# Patient Record
Sex: Female | Born: 1945 | Race: White | Hispanic: No | Marital: Married | Smoking: Former smoker
Health system: Southern US, Community
[De-identification: ages and names within clinical notes are randomized; demographics above are authoritative.]

---

## 1998-03-07 ENCOUNTER — Ambulatory Visit (HOSPITAL_COMMUNITY): Admission: RE | Admit: 1998-03-07 | Discharge: 1998-03-07 | Payer: Self-pay | Admitting: Obstetrics & Gynecology

## 1998-05-09 ENCOUNTER — Other Ambulatory Visit: Admission: RE | Admit: 1998-05-09 | Discharge: 1998-05-09 | Payer: Self-pay | Admitting: Obstetrics & Gynecology

## 1999-03-13 ENCOUNTER — Encounter: Payer: Self-pay | Admitting: Obstetrics & Gynecology

## 1999-03-13 ENCOUNTER — Ambulatory Visit (HOSPITAL_COMMUNITY): Admission: RE | Admit: 1999-03-13 | Discharge: 1999-03-13 | Payer: Self-pay | Admitting: Obstetrics & Gynecology

## 1999-06-12 ENCOUNTER — Other Ambulatory Visit: Admission: RE | Admit: 1999-06-12 | Discharge: 1999-06-12 | Payer: Self-pay | Admitting: Obstetrics and Gynecology

## 2000-03-18 ENCOUNTER — Encounter: Payer: Self-pay | Admitting: Obstetrics & Gynecology

## 2000-03-18 ENCOUNTER — Ambulatory Visit (HOSPITAL_COMMUNITY): Admission: RE | Admit: 2000-03-18 | Discharge: 2000-03-18 | Payer: Self-pay | Admitting: Obstetrics & Gynecology

## 2000-07-29 ENCOUNTER — Other Ambulatory Visit: Admission: RE | Admit: 2000-07-29 | Discharge: 2000-07-29 | Payer: Self-pay | Admitting: Obstetrics & Gynecology

## 2000-09-16 ENCOUNTER — Ambulatory Visit (HOSPITAL_COMMUNITY): Admission: RE | Admit: 2000-09-16 | Discharge: 2000-09-16 | Payer: Self-pay | Admitting: Gastroenterology

## 2000-11-09 ENCOUNTER — Encounter: Payer: Self-pay | Admitting: Surgery

## 2000-11-09 ENCOUNTER — Inpatient Hospital Stay (HOSPITAL_COMMUNITY): Admission: EM | Admit: 2000-11-09 | Discharge: 2000-11-11 | Payer: Self-pay | Admitting: *Deleted

## 2000-11-09 ENCOUNTER — Encounter (INDEPENDENT_AMBULATORY_CARE_PROVIDER_SITE_OTHER): Payer: Self-pay | Admitting: Specialist

## 2001-03-24 ENCOUNTER — Ambulatory Visit (HOSPITAL_COMMUNITY): Admission: RE | Admit: 2001-03-24 | Discharge: 2001-03-24 | Payer: Self-pay | Admitting: Obstetrics and Gynecology

## 2001-03-24 ENCOUNTER — Encounter: Payer: Self-pay | Admitting: Obstetrics and Gynecology

## 2001-08-04 ENCOUNTER — Other Ambulatory Visit: Admission: RE | Admit: 2001-08-04 | Discharge: 2001-08-04 | Payer: Self-pay | Admitting: Obstetrics and Gynecology

## 2001-09-08 ENCOUNTER — Other Ambulatory Visit: Admission: RE | Admit: 2001-09-08 | Discharge: 2001-09-08 | Payer: Self-pay | Admitting: Obstetrics and Gynecology

## 2002-03-23 ENCOUNTER — Encounter: Payer: Self-pay | Admitting: Obstetrics and Gynecology

## 2002-03-23 ENCOUNTER — Ambulatory Visit (HOSPITAL_COMMUNITY): Admission: RE | Admit: 2002-03-23 | Discharge: 2002-03-23 | Payer: Self-pay | Admitting: Obstetrics and Gynecology

## 2002-08-10 ENCOUNTER — Other Ambulatory Visit: Admission: RE | Admit: 2002-08-10 | Discharge: 2002-08-10 | Payer: Self-pay | Admitting: Obstetrics and Gynecology

## 2003-10-25 ENCOUNTER — Ambulatory Visit (HOSPITAL_COMMUNITY): Admission: RE | Admit: 2003-10-25 | Discharge: 2003-10-25 | Payer: Self-pay | Admitting: Internal Medicine

## 2004-12-18 ENCOUNTER — Ambulatory Visit (HOSPITAL_COMMUNITY): Admission: RE | Admit: 2004-12-18 | Discharge: 2004-12-18 | Payer: Self-pay | Admitting: Internal Medicine

## 2005-11-05 ENCOUNTER — Other Ambulatory Visit: Admission: RE | Admit: 2005-11-05 | Discharge: 2005-11-05 | Payer: Self-pay | Admitting: Internal Medicine

## 2005-12-24 ENCOUNTER — Ambulatory Visit (HOSPITAL_COMMUNITY): Admission: RE | Admit: 2005-12-24 | Discharge: 2005-12-24 | Payer: Self-pay | Admitting: Internal Medicine

## 2006-12-30 ENCOUNTER — Ambulatory Visit (HOSPITAL_COMMUNITY): Admission: RE | Admit: 2006-12-30 | Discharge: 2006-12-30 | Payer: Self-pay | Admitting: Internal Medicine

## 2007-12-08 ENCOUNTER — Other Ambulatory Visit: Admission: RE | Admit: 2007-12-08 | Discharge: 2007-12-08 | Payer: Self-pay | Admitting: Internal Medicine

## 2008-01-05 ENCOUNTER — Ambulatory Visit (HOSPITAL_COMMUNITY): Admission: RE | Admit: 2008-01-05 | Discharge: 2008-01-05 | Payer: Self-pay | Admitting: Internal Medicine

## 2009-01-31 ENCOUNTER — Ambulatory Visit (HOSPITAL_COMMUNITY): Admission: RE | Admit: 2009-01-31 | Discharge: 2009-01-31 | Payer: Self-pay | Admitting: Family Medicine

## 2010-02-06 ENCOUNTER — Ambulatory Visit (HOSPITAL_COMMUNITY): Admission: RE | Admit: 2010-02-06 | Discharge: 2010-02-06 | Payer: Self-pay | Admitting: Internal Medicine

## 2010-02-20 ENCOUNTER — Other Ambulatory Visit: Admission: RE | Admit: 2010-02-20 | Discharge: 2010-02-20 | Payer: Self-pay | Admitting: Internal Medicine

## 2011-01-08 ENCOUNTER — Other Ambulatory Visit (HOSPITAL_COMMUNITY): Payer: Self-pay | Admitting: Internal Medicine

## 2011-01-08 DIAGNOSIS — Z1231 Encounter for screening mammogram for malignant neoplasm of breast: Secondary | ICD-10-CM

## 2011-01-08 DIAGNOSIS — Z1239 Encounter for other screening for malignant neoplasm of breast: Secondary | ICD-10-CM

## 2011-02-12 ENCOUNTER — Ambulatory Visit (HOSPITAL_COMMUNITY)
Admission: RE | Admit: 2011-02-12 | Discharge: 2011-02-12 | Disposition: A | Discharge status: Home / Self Care | Payer: BC Managed Care – PPO | Source: Ambulatory Visit | Attending: Internal Medicine | Admitting: Internal Medicine

## 2011-02-12 DIAGNOSIS — Z1231 Encounter for screening mammogram for malignant neoplasm of breast: Secondary | ICD-10-CM

## 2011-04-30 NOTE — H&P (Signed)
[place]. [facility]  Patient:    Veronica Barker, Veronica Barker                      MRN: [id number] Adm. Date:  [id number] Attending:  Andre Lefort CC:         Dr. Louanna Raw, [facility]  Edward Jolly, M.D.  Dr. Charna Elizabeth   History and Physical  DATE OF BIRTH:  May 12, 1946  HISTORY OF ILLNESS:  Veronica Barker is a 65 year old white female whose primary medical doctor is Dr. Wilson Singer, a gynecologist, and she has recently seen Dr. Loreta Ave for a GI evaluation.  She has had a head cold for the last few days and awoke this morning with abdominal pain which led to nausea and vomiting.  At first, she thought she had a GI bug but as the pain became worse, she went to the [facility] where she saw Dr. Louanna Raw.  His clinical impression was that she probably had appendicitis and asked me to see her.  Veronica Barker has had no prior history of peptic ulcer disease, liver disease, gallbladder disease, or pancreatic disease. Apparently she had a guaiac positive stool about three to four months ago and had a colonoscopy by Dr. Charna Elizabeth about two months ago which by the patients report, was negative.  She has had no prior abdominal surgery.  ALLERGIES:  She has no allergies.  MEDICATIONS:  Premphase and Caltrate.  REVIEW OF SYSTEMS:  PULMONARY:  Smokes cigarettes and knows this is bad for her health.  She also has an upper respiratory infection right now with a head cold and a kind of bronchitic cough.  CARDIAC:  She has no history of heart disease, chest pain, or hypertension.  GASTROINTESTINAL: See History of Present Illness.  NEUROLOGIC:  No history of kidney stones or kidney infection.  GYN:  Her last period was three to four months ago.  She says she has been going through menopause over the last years time.  She has had no prior gynecologic problems.  SOCIAL HISTORY:  She works at [employer] and is accompanied  by her husband in the Emergency Room.  PHYSICAL EXAMINATION:  VITAL SIGNS:  Her blood pressure is 137/74.  Pulse 59.  Respirations 16.  Temperature 96.8.  HEENT:  She had a little bit of a head cold.  I examined both of her ears and her right ear is obscured with ear wax.  Her left TM is mildly inflamed.  She may have some very mild otitis media on the left side.  Her throat is mildly red but otherwise, unremarkable.  NECK:  Her neck is supple without mass or thyromegaly.  LUNGS:  Her lungs are clear to osculation with a little bit of congestion in the upper airway.  CARDIAC:  Her heart has a regular rate and rhythm without murmur or rub.  ABDOMEN:  Her abdomen shows decrease in present bowel sounds with localized tenderness, guarding, and rebound in the right lower quadrant.  RECTAL:  I did not do a rectum because of recent colonoscopy.  EXTREMITIES:  She has good strength of all four extremities.  NEUROLOGIC:  Grossly intact.  LABORATORY AND ACCESSORY DATA:  Sodium 139.  Potassium 4.0.  Chloride 106. CO2 26.  Glucose 113. Liver functions are all within normal limits.  Amylase 42.  White blood count 14,400.  Hemoglobin 12.6.  Hematocrit 37.7.  Urinalysis was negative.  ASSESSMENT:  My clinical impression is that she probably has appendicitis and may have gastroenteritis.  I discussed with the patient and her husband about proceeding with surgery which I think she will be best served by undergoing. I outlined the indications, surgery potentials, risk of surgery including bleeding, injury to bowel and possibly open surgery.  PLAN: 1. We will pursue a laparoscopic appendix at this time. 2. Smokes cigarettes which she knows is bad for her health. 3. Upper inspiratory infection. DD:  11/09/00 TD:  11/09/00 Job: [id number]

## 2011-04-30 NOTE — Procedures (Signed)
[place]. [facility]  Patient:    Veronica Barker, Veronica Barker                      MRN: [id number] Proc. Date: 09/16/00 Adm. Date:  [id number] Attending:  Charna Elizabeth CC:         Cecilio Asper, M.D.   Procedure Report  DATE OF BIRTH:  12/13/1946  REFERRING PHYSICIAN:  Cecilio Asper, M.D.  PROCEDURE:  Colonoscopy.  ENDOSCOPIST:  Anselmo Rod, M.D.  INSTRUMENTS USED:  Olympus video colonoscope.  INDICATIONS:  Guaiac positive stools in a 65 year old white female.  Rule out colonic polyps, masses, hemorrhoids, etc.  PREPROCEDURE PREPARATION:  Informed consent was procured from the patient. The patient was fasted for eight hours prior to the procedure and prepped with a bottle of magnesium citrate and a gallon of NuLytely the night prior to the procedure.  PREPROCEDURE PHYSICAL EXAMINATION:  VITAL SIGNS:  The patient had stable vital signs.  NECK:  Supple.  CHEST:  Clear to auscultation, S1, S2 regular.  ABDOMEN:  Soft with normal abdominal bowel sounds.  DESCRIPTION OF PROCEDURE:  The patient was placed in the left lateral decubitus position and sedated with 50 mg of Demerol and 5 mg of Versed intravenously.  Once the patient was adequately sedated and maintained on low-flow oxygen and continuous cardiac monitoring, the Olympus video colonoscope was advanced from the rectum to the cecum without difficulty.  The patient had significant left-sided diverticulosis.  No masses, polyps, erosions, ulcerations, or hemorrhoids were seen.  The procedure was completed up to the cecum.  The entire colonic mucosa, cecum, right colon, and transverse colon appeared healthy.  IMPRESSION:  Left-sided diverticulosis, otherwise normal appearing colon.  RECOMMENDATIONS: 1. The patient has been advised to increase fluids and fiber in her diet. 2. Repeat guaiac testing has been recommended on an outpatient basis.  Further    recommendations will be  made at that time. DD:  09/16/00 TD:  09/16/00 Job: [id number]

## 2011-04-30 NOTE — Op Note (Signed)
[place]. [facility]  Patient:    Veronica Barker, Veronica Barker                      MRN: [id number] Proc. Date: 11/09/00 Adm. Date:  [id number] Disc. Date: [id number] Attending:  Charna Elizabeth CC:         Dr. Louanna Raw, [facility]  Dr. Wilson Singer  Dr. Anselmo Rod   Operative Report  DATE OF BIRTH:  [birth year]  PREOPERATIVE DIAGNOSIS:  Appendicitis.  POSTOPERATIVE DIAGNOSIS:  Acute appendicitis.  PROCEDURE:  Laparoscopic appendectomy.  SURGEON:  Sandria Bales. Ezzard Standing, M.D.  ASSISTANT:  None.  ANESTHESIA:  General endotracheal.  ESTIMATED BLOOD LOSS:  Minimal.  INDICATION FOR PROCEDURE: Veronica Barker she is a 65 year old white female, who awoke this morning with diffuse abdominal pain, which is now localized to the right lower quadrant.  She has a [place] blood count of [address] and a physical examination consistent with acute appendicitis.  She now comes for attempted appendectomy.  OPERATIVE NOTE:  Patient placed in a supine position with the left arm tucked, right arm to her side.  She was already given 2 g of cefotetan at the initiation of the procedure.  She had a Foley catheter in place.  Her abdomen was prepped with Betadine solution and sterilely draped.  An infraumbilical incision was made with sharp dissection and carried down to the abdominal cavity.  A 0 degree laparoscope was inserted through a 12 mm Hasson trocar and this was secured with a 0 Vicryl suture.  Right and left lobes of the liver were unremarkable, the stomach was unremarkable, the gallbladder was unremarkable.  Her pelvic organs, which could be seen well, including her uterus and both tubes were unremarkable.  In the right lower quadrant, she had an inflammatory area consistent with acute appendicitis.  I then placed two additional trocars, a 5 mm Ethicon trocar in the right subcostal location and a 10 mm Ethicon trocar in the left lower quadrant location.  The  mesentery of the appendix was identified, divided with the Harmonic scalpel down to the base of the appendix.  I then used a vascular EndoGIA stapler to staple across the base of the appendix.  I then placed the appendix in the EndoCatch bag and delivered it through the umbilicus.  I then irrigated out the abdominal cavity.  There was no bleeding.  The bowel staple line looked good.  I then removed each trocar under direct visualization.  The umbilical trocar was closed with a 0 Vicryl suture.  The skin at each site was closed with a 4-0 Monocryl suture.  Each site was painted with tincture of benzoin, Steri-Strips and sterilely dressed.  The patient tolerated the procedure well, was transported to the recovery room in good condition.  Needle and sponge count were correct at the end of the case. DD:  11/09/00 TD:  11/09/00 Job: [id number]

## 2012-03-24 ENCOUNTER — Other Ambulatory Visit (HOSPITAL_COMMUNITY): Payer: Self-pay | Admitting: Internal Medicine

## 2012-03-24 DIAGNOSIS — Z1231 Encounter for screening mammogram for malignant neoplasm of breast: Secondary | ICD-10-CM

## 2012-04-21 ENCOUNTER — Ambulatory Visit (HOSPITAL_COMMUNITY)
Admission: RE | Admit: 2012-04-21 | Discharge: 2012-04-21 | Disposition: A | Discharge status: Home / Self Care | Payer: BC Managed Care – PPO | Source: Ambulatory Visit | Attending: Internal Medicine | Admitting: Internal Medicine

## 2012-04-21 DIAGNOSIS — Z1231 Encounter for screening mammogram for malignant neoplasm of breast: Secondary | ICD-10-CM | POA: Insufficient documentation

## 2012-05-26 ENCOUNTER — Other Ambulatory Visit (HOSPITAL_COMMUNITY)
Admission: RE | Admit: 2012-05-26 | Discharge: 2012-05-26 | Disposition: A | Discharge status: Home / Self Care | Payer: BC Managed Care – PPO | Source: Ambulatory Visit | Attending: Internal Medicine | Admitting: Internal Medicine

## 2012-05-26 ENCOUNTER — Other Ambulatory Visit: Payer: Self-pay | Admitting: Internal Medicine

## 2012-05-26 DIAGNOSIS — Z124 Encounter for screening for malignant neoplasm of cervix: Secondary | ICD-10-CM | POA: Insufficient documentation

## 2012-07-07 ENCOUNTER — Other Ambulatory Visit: Payer: Self-pay | Admitting: Internal Medicine

## 2012-07-07 DIAGNOSIS — Z0000 Encounter for general adult medical examination without abnormal findings: Secondary | ICD-10-CM

## 2012-07-14 ENCOUNTER — Ambulatory Visit
Admission: RE | Admit: 2012-07-14 | Discharge: 2012-07-14 | Disposition: A | Discharge status: Home / Self Care | Payer: No Typology Code available for payment source | Source: Ambulatory Visit | Attending: Internal Medicine | Admitting: Internal Medicine

## 2012-07-14 DIAGNOSIS — Z0000 Encounter for general adult medical examination without abnormal findings: Secondary | ICD-10-CM

## 2013-04-02 ENCOUNTER — Other Ambulatory Visit (HOSPITAL_COMMUNITY): Payer: Self-pay | Admitting: Internal Medicine

## 2013-04-02 DIAGNOSIS — Z1231 Encounter for screening mammogram for malignant neoplasm of breast: Secondary | ICD-10-CM

## 2013-04-23 ENCOUNTER — Ambulatory Visit (HOSPITAL_COMMUNITY)
Admission: RE | Admit: 2013-04-23 | Discharge: 2013-04-23 | Disposition: A | Discharge status: Home / Self Care | Payer: Medicare Other | Source: Ambulatory Visit | Attending: Internal Medicine | Admitting: Internal Medicine

## 2013-04-23 DIAGNOSIS — Z1231 Encounter for screening mammogram for malignant neoplasm of breast: Secondary | ICD-10-CM | POA: Insufficient documentation

## 2013-06-08 ENCOUNTER — Other Ambulatory Visit: Payer: Self-pay | Admitting: Internal Medicine

## 2013-06-08 DIAGNOSIS — R911 Solitary pulmonary nodule: Secondary | ICD-10-CM

## 2013-07-20 ENCOUNTER — Ambulatory Visit
Admission: RE | Admit: 2013-07-20 | Discharge: 2013-07-20 | Disposition: A | Discharge status: Home / Self Care | Payer: Medicare Other | Source: Ambulatory Visit | Attending: Internal Medicine | Admitting: Internal Medicine

## 2013-07-20 DIAGNOSIS — R911 Solitary pulmonary nodule: Secondary | ICD-10-CM

## 2014-05-17 ENCOUNTER — Other Ambulatory Visit (HOSPITAL_COMMUNITY): Payer: Self-pay | Admitting: Internal Medicine

## 2014-05-17 DIAGNOSIS — Z1231 Encounter for screening mammogram for malignant neoplasm of breast: Secondary | ICD-10-CM

## 2014-05-23 ENCOUNTER — Ambulatory Visit (HOSPITAL_COMMUNITY)
Admission: RE | Admit: 2014-05-23 | Discharge: 2014-05-23 | Disposition: A | Discharge status: Home / Self Care | Payer: Medicare Other | Source: Ambulatory Visit | Attending: Internal Medicine | Admitting: Internal Medicine

## 2014-05-23 DIAGNOSIS — Z1231 Encounter for screening mammogram for malignant neoplasm of breast: Secondary | ICD-10-CM | POA: Insufficient documentation

## 2016-01-01 DIAGNOSIS — H524 Presbyopia: Secondary | ICD-10-CM | POA: Diagnosis not present

## 2016-01-01 DIAGNOSIS — H5203 Hypermetropia, bilateral: Secondary | ICD-10-CM | POA: Diagnosis not present

## 2016-01-01 DIAGNOSIS — H52223 Regular astigmatism, bilateral: Secondary | ICD-10-CM | POA: Diagnosis not present

## 2016-06-30 DIAGNOSIS — Z1389 Encounter for screening for other disorder: Secondary | ICD-10-CM | POA: Diagnosis not present

## 2016-06-30 DIAGNOSIS — Z0000 Encounter for general adult medical examination without abnormal findings: Secondary | ICD-10-CM | POA: Diagnosis not present

## 2016-06-30 DIAGNOSIS — E782 Mixed hyperlipidemia: Secondary | ICD-10-CM | POA: Diagnosis not present

## 2016-06-30 DIAGNOSIS — E669 Obesity, unspecified: Secondary | ICD-10-CM | POA: Diagnosis not present

## 2016-06-30 DIAGNOSIS — Z6830 Body mass index (BMI) 30.0-30.9, adult: Secondary | ICD-10-CM | POA: Diagnosis not present

## 2016-08-19 DIAGNOSIS — Z86010 Personal history of colonic polyps: Secondary | ICD-10-CM | POA: Diagnosis not present

## 2016-08-19 DIAGNOSIS — Z1211 Encounter for screening for malignant neoplasm of colon: Secondary | ICD-10-CM | POA: Diagnosis not present

## 2016-08-19 DIAGNOSIS — K5730 Diverticulosis of large intestine without perforation or abscess without bleeding: Secondary | ICD-10-CM | POA: Diagnosis not present

## 2016-09-08 DIAGNOSIS — K5730 Diverticulosis of large intestine without perforation or abscess without bleeding: Secondary | ICD-10-CM | POA: Diagnosis not present

## 2016-09-08 DIAGNOSIS — K635 Polyp of colon: Secondary | ICD-10-CM | POA: Diagnosis not present

## 2016-09-08 DIAGNOSIS — D123 Benign neoplasm of transverse colon: Secondary | ICD-10-CM | POA: Diagnosis not present

## 2016-09-08 DIAGNOSIS — Z86010 Personal history of colonic polyps: Secondary | ICD-10-CM | POA: Diagnosis not present

## 2016-09-08 DIAGNOSIS — Z1211 Encounter for screening for malignant neoplasm of colon: Secondary | ICD-10-CM | POA: Diagnosis not present

## 2016-09-23 ENCOUNTER — Other Ambulatory Visit: Payer: Self-pay | Admitting: Internal Medicine

## 2016-09-23 DIAGNOSIS — Z1231 Encounter for screening mammogram for malignant neoplasm of breast: Secondary | ICD-10-CM

## 2016-10-06 ENCOUNTER — Ambulatory Visit
Admission: RE | Admit: 2016-10-06 | Discharge: 2016-10-06 | Disposition: A | Discharge status: Home / Self Care | Payer: PPO | Source: Ambulatory Visit | Attending: Internal Medicine | Admitting: Internal Medicine

## 2016-10-06 DIAGNOSIS — Z1231 Encounter for screening mammogram for malignant neoplasm of breast: Secondary | ICD-10-CM | POA: Diagnosis not present

## 2016-12-07 DIAGNOSIS — D2271 Melanocytic nevi of right lower limb, including hip: Secondary | ICD-10-CM | POA: Diagnosis not present

## 2016-12-07 DIAGNOSIS — D225 Melanocytic nevi of trunk: Secondary | ICD-10-CM | POA: Diagnosis not present

## 2016-12-07 DIAGNOSIS — D2272 Melanocytic nevi of left lower limb, including hip: Secondary | ICD-10-CM | POA: Diagnosis not present

## 2016-12-07 DIAGNOSIS — L821 Other seborrheic keratosis: Secondary | ICD-10-CM | POA: Diagnosis not present

## 2016-12-07 DIAGNOSIS — L814 Other melanin hyperpigmentation: Secondary | ICD-10-CM | POA: Diagnosis not present

## 2016-12-21 ENCOUNTER — Encounter: Payer: Self-pay | Admitting: Podiatry

## 2016-12-21 ENCOUNTER — Ambulatory Visit (INDEPENDENT_AMBULATORY_CARE_PROVIDER_SITE_OTHER): Payer: PPO

## 2016-12-21 ENCOUNTER — Ambulatory Visit (INDEPENDENT_AMBULATORY_CARE_PROVIDER_SITE_OTHER): Payer: PPO | Admitting: Podiatry

## 2016-12-21 ENCOUNTER — Other Ambulatory Visit: Payer: Self-pay | Admitting: *Deleted

## 2016-12-21 VITALS — BP 152/76 | HR 62 | Resp 16

## 2016-12-21 DIAGNOSIS — M779 Enthesopathy, unspecified: Secondary | ICD-10-CM

## 2016-12-21 DIAGNOSIS — M79671 Pain in right foot: Secondary | ICD-10-CM | POA: Diagnosis not present

## 2016-12-21 DIAGNOSIS — M778 Other enthesopathies, not elsewhere classified: Secondary | ICD-10-CM

## 2016-12-21 DIAGNOSIS — M7751 Other enthesopathy of right foot: Secondary | ICD-10-CM

## 2016-12-21 NOTE — Progress Notes (Signed)
   Subjective:    Patient ID: Veronica Barker, female    DOB: Apr 16, 1946, 71 y.o.   MRN: [id number]  HPI: She presents today with a three-week history of pain to the dorsal aspect of her right foot overlying the third knuckle. She states that she is an avid bowler and likes to use the bicycle and elliptical and treadmill. She denies any trauma.    Review of Systems  HENT: Positive for sinus pressure.   Gastrointestinal: Positive for abdominal distention.  Hematological: Bruises/bleeds easily.  All other systems reviewed and are negative.      Objective:   Physical Exam: Vital signs are stable alert and oriented 3. Pulses are palpable. Neurologic sensorium is intact. Deep tendon reflexes are intact. Muscle strength +5 over 5 dorsiflexion plantar flexors and inverters and everters all into the musculature is intact. She has pain on end range of motion of the third metatarsophalangeal joint. Radiographs do not demonstrate this time a fracture through the third metatarsal. No other osseous abnormalities are noted. Cutaneous evaluation demonstrates supple well-hydrated cutis no erythema edema cellulitis drainage or odor other than the edema overlying the joints.        Assessment & Plan:  Capsulitis third metatarsophalangeal joint right foot.  Plan: Injected Kenalog periarticular around the third metatarsophalangeal joint right foot and placed her in a Cam Walker suggested she stay in his cam walker until follow-up with me in 1 month.

## 2017-01-18 ENCOUNTER — Ambulatory Visit (INDEPENDENT_AMBULATORY_CARE_PROVIDER_SITE_OTHER): Payer: PPO | Admitting: Podiatry

## 2017-01-18 ENCOUNTER — Ambulatory Visit (INDEPENDENT_AMBULATORY_CARE_PROVIDER_SITE_OTHER): Payer: PPO

## 2017-01-18 ENCOUNTER — Encounter: Payer: Self-pay | Admitting: Podiatry

## 2017-01-18 DIAGNOSIS — M778 Other enthesopathies, not elsewhere classified: Secondary | ICD-10-CM

## 2017-01-18 DIAGNOSIS — M7751 Other enthesopathy of right foot: Secondary | ICD-10-CM | POA: Diagnosis not present

## 2017-01-18 DIAGNOSIS — M779 Enthesopathy, unspecified: Principal | ICD-10-CM

## 2017-01-18 NOTE — Progress Notes (Signed)
She presents today for follow-up of capsulitis to the third metatarsal phalangeal joint of the right foot. She states that she is doing much better and the pain is no longer present. She continues to wear her Darco shoe on a regular basis.  Objective: Vital signs are stable she is alert and oriented 3. Pulses are palpable. She has no further pain on palpation or range of motion of the third or second metatarsophalangeal joint on the right foot. Radiographs taken today confirm that there are no fractures and it appears that the inflammation has decreased considerably due to the cortisone injection.  Assessment: Well-healing capsulitis third metatarsophalangeal joint right foot.  Plan: Follow up with me on a regular basis she may get back to her bowling.  Dr. Roselind Messier DPM

## 2017-02-04 DIAGNOSIS — H5203 Hypermetropia, bilateral: Secondary | ICD-10-CM | POA: Diagnosis not present

## 2017-07-05 DIAGNOSIS — Z6831 Body mass index (BMI) 31.0-31.9, adult: Secondary | ICD-10-CM | POA: Diagnosis not present

## 2017-07-05 DIAGNOSIS — Z131 Encounter for screening for diabetes mellitus: Secondary | ICD-10-CM | POA: Diagnosis not present

## 2017-07-05 DIAGNOSIS — Z0000 Encounter for general adult medical examination without abnormal findings: Secondary | ICD-10-CM | POA: Diagnosis not present

## 2017-07-05 DIAGNOSIS — Z1389 Encounter for screening for other disorder: Secondary | ICD-10-CM | POA: Diagnosis not present

## 2017-07-05 DIAGNOSIS — E669 Obesity, unspecified: Secondary | ICD-10-CM | POA: Diagnosis not present

## 2017-07-05 DIAGNOSIS — E782 Mixed hyperlipidemia: Secondary | ICD-10-CM | POA: Diagnosis not present

## 2017-07-16 DIAGNOSIS — H0019 Chalazion unspecified eye, unspecified eyelid: Secondary | ICD-10-CM | POA: Diagnosis not present

## 2017-07-16 DIAGNOSIS — E782 Mixed hyperlipidemia: Secondary | ICD-10-CM | POA: Insufficient documentation

## 2017-07-22 DIAGNOSIS — H0012 Chalazion right lower eyelid: Secondary | ICD-10-CM | POA: Diagnosis not present

## 2017-10-10 ENCOUNTER — Other Ambulatory Visit: Payer: Self-pay | Admitting: Internal Medicine

## 2017-10-10 DIAGNOSIS — Z1231 Encounter for screening mammogram for malignant neoplasm of breast: Secondary | ICD-10-CM

## 2017-10-25 ENCOUNTER — Ambulatory Visit: Payer: PPO

## 2017-11-14 ENCOUNTER — Ambulatory Visit
Admission: RE | Admit: 2017-11-14 | Discharge: 2017-11-14 | Disposition: A | Discharge status: Home / Self Care | Payer: PPO | Source: Ambulatory Visit | Attending: Internal Medicine | Admitting: Internal Medicine

## 2017-11-14 DIAGNOSIS — Z1231 Encounter for screening mammogram for malignant neoplasm of breast: Secondary | ICD-10-CM

## 2018-06-29 ENCOUNTER — Ambulatory Visit: Payer: PPO

## 2018-06-29 ENCOUNTER — Ambulatory Visit: Payer: PPO | Admitting: Podiatry

## 2018-06-29 ENCOUNTER — Encounter: Payer: Self-pay | Admitting: Podiatry

## 2018-06-29 DIAGNOSIS — G5791 Unspecified mononeuropathy of right lower limb: Secondary | ICD-10-CM

## 2018-06-29 DIAGNOSIS — M778 Other enthesopathies, not elsewhere classified: Secondary | ICD-10-CM

## 2018-06-29 DIAGNOSIS — M779 Enthesopathy, unspecified: Principal | ICD-10-CM

## 2018-07-01 NOTE — Progress Notes (Signed)
She presents today with a chief complaint of pain right in here she points to the first metatarsal space of the right foot.  Objective: Vital signs are stable she is alert and oriented x3 there is no erythema edema saline strange odor has pain on palpation the peroneal nerve within the first intermetatarsal space right and around the second metatarsophalangeal joint.  Assessment: Neuritis deep peroneal nerve right.  Plan: After sterile Betadine skin prep I injected 20 mg Kenalog 5 mg Marcaine proximal portion of the first intermetatarsal space.  Tolerated procedure well without complications follow-up with me as needed.

## 2018-07-19 DIAGNOSIS — R21 Rash and other nonspecific skin eruption: Secondary | ICD-10-CM | POA: Diagnosis not present

## 2018-07-19 DIAGNOSIS — Z0000 Encounter for general adult medical examination without abnormal findings: Secondary | ICD-10-CM | POA: Diagnosis not present

## 2018-07-19 DIAGNOSIS — Z1389 Encounter for screening for other disorder: Secondary | ICD-10-CM | POA: Diagnosis not present

## 2018-07-19 DIAGNOSIS — E782 Mixed hyperlipidemia: Secondary | ICD-10-CM | POA: Diagnosis not present

## 2018-07-19 DIAGNOSIS — I8391 Asymptomatic varicose veins of right lower extremity: Secondary | ICD-10-CM | POA: Diagnosis not present

## 2018-07-21 ENCOUNTER — Other Ambulatory Visit: Payer: Self-pay

## 2018-07-21 DIAGNOSIS — I83811 Varicose veins of right lower extremities with pain: Secondary | ICD-10-CM

## 2018-10-20 ENCOUNTER — Encounter: Payer: Self-pay | Admitting: Vascular Surgery

## 2018-10-20 ENCOUNTER — Ambulatory Visit (HOSPITAL_COMMUNITY)
Admission: RE | Admit: 2018-10-20 | Discharge: 2018-10-20 | Disposition: A | Discharge status: Home / Self Care | Payer: PPO | Source: Ambulatory Visit | Attending: Internal Medicine | Admitting: Internal Medicine

## 2018-10-20 ENCOUNTER — Other Ambulatory Visit: Payer: Self-pay

## 2018-10-20 ENCOUNTER — Ambulatory Visit (INDEPENDENT_AMBULATORY_CARE_PROVIDER_SITE_OTHER): Payer: PPO | Admitting: Vascular Surgery

## 2018-10-20 VITALS — BP 168/86 | HR 69 | Temp 97.6°F | Resp 18 | Ht 62.0 in | Wt 171.0 lb

## 2018-10-20 DIAGNOSIS — I83811 Varicose veins of right lower extremities with pain: Secondary | ICD-10-CM | POA: Insufficient documentation

## 2018-10-20 NOTE — Progress Notes (Signed)
Patient ID: Veronica Barker, female   DOB: 1946-01-08, 72 y.o.   MRN: [id number]  Reason for Consult: New Patient (Initial Visit) (R thigh V V's, large and painful.  Dr. Lavone Orn. No prior treatment.)   Referred by Lavone Orn, MD  Subjective:     HPI:  Veronica Barker is a 72 y.o. female without significant past medical history presents for evaluation of spider veins and right lower extremity varicose veins.  She does not have any bleeding.  She is never had a deep venous thrombosis.  She does have pain overlying the site of the varicosity.  States that it hurts worse when wearing pants that touches it and also when laying on it.  She has never worn compression stockings.  She has never had any venous interventions in her bilateral lower extremity.  Past medical history: Hyperlipidemia, urticaria, osteopenia, obesity  Past surgical history: Appendectomy, repair of defect right eardrum  Family history: Father deceased 82 years from lung disease, mother deceased 36 years pneumonia  Short Social History:  Social History   Tobacco Use  . Smoking status: Former Smoker    Last attempt to quit: 2008    Years since quitting: 11.8  . Smokeless tobacco: Never Used  Substance Use Topics  . Alcohol use: Yes    No Known Allergies  Current Outpatient Medications  Medication Sig Dispense Refill  . aspirin EC 81 MG tablet Take 81 mg by mouth as needed.     . calcium carbonate (CALCIUM 600) 1500 (600 Ca) MG TABS tablet Take by mouth 2 (two) times daily with a meal.    . simvastatin (ZOCOR) 20 MG tablet      No current facility-administered medications for this visit.     Review of Systems  Constitutional:  Constitutional negative. HENT: HENT negative.  Eyes: Eyes negative.  Respiratory: Respiratory negative.  Cardiovascular: Cardiovascular negative.  GI: Gastrointestinal negative.  Musculoskeletal: Musculoskeletal negative.  Skin: Skin negative.  Neurological: Neurological  negative. Hematologic: Hematologic/lymphatic negative.  Psychiatric: Psychiatric negative.        Objective:  Objective   Vitals:   10/20/18 1357  BP: (!) 168/86  Pulse: 69  Resp: 18  Temp: 97.6 F (36.4 C)  TempSrc: Oral  SpO2: 96%  Weight: 171 lb (77.6 kg)  Height: 5\' 2"  (1.575 m)   Body mass index is 31.28 kg/m.  Physical Exam  Constitutional: She is oriented to person, place, and time. She appears well-developed.  HENT:  Head: Normocephalic.  Eyes: Pupils are equal, round, and reactive to light.  Neck: Normal range of motion. Neck supple.  Pulmonary/Chest: Effort normal.  Abdominal: Soft.  Musculoskeletal: Normal range of motion.  Neurological: She is alert and oriented to person, place, and time.  Skin: Skin is warm and dry.  Psychiatric: She has a normal mood and affect. Her behavior is normal. Judgment and thought content normal.      Data: I have independently interpreted her right lower extremity venous reflux study which demonstrates greater saphenous vein at the junction 0.72 cm and refluxing there 3880 ms     Assessment/Plan:    72 year old female presents for evaluation bilateral lower extremity spider veins and large right lower extremity varicosity that is painful.  She does not have significant reflux although the saphenous vein is 0.72 cm at the junction and is heavily refluxing there.  We will get her in thigh-high compression stockings have her follow-up in 3 months or hopefully we can consider stab  phlebectomy.  She understands that she would have to pay for treatment of her spider veins at this time she is not interested.    Waynetta Sandy MD [facility]

## 2018-11-28 DIAGNOSIS — L2089 Other atopic dermatitis: Secondary | ICD-10-CM | POA: Diagnosis not present

## 2018-11-28 DIAGNOSIS — L918 Other hypertrophic disorders of the skin: Secondary | ICD-10-CM | POA: Diagnosis not present

## 2018-11-28 DIAGNOSIS — L218 Other seborrheic dermatitis: Secondary | ICD-10-CM | POA: Diagnosis not present

## 2018-11-28 DIAGNOSIS — L821 Other seborrheic keratosis: Secondary | ICD-10-CM | POA: Diagnosis not present

## 2018-11-28 DIAGNOSIS — L820 Inflamed seborrheic keratosis: Secondary | ICD-10-CM | POA: Diagnosis not present

## 2019-01-18 ENCOUNTER — Ambulatory Visit (INDEPENDENT_AMBULATORY_CARE_PROVIDER_SITE_OTHER): Payer: PPO | Admitting: Vascular Surgery

## 2019-01-18 ENCOUNTER — Other Ambulatory Visit: Payer: Self-pay

## 2019-01-18 ENCOUNTER — Encounter: Payer: Self-pay | Admitting: Vascular Surgery

## 2019-01-18 VITALS — BP 135/76 | HR 70 | Resp 18 | Ht 62.0 in | Wt 171.0 lb

## 2019-01-18 DIAGNOSIS — I83811 Varicose veins of right lower extremities with pain: Secondary | ICD-10-CM | POA: Diagnosis not present

## 2019-01-18 NOTE — Progress Notes (Signed)
Patient name: Veronica Barker MRN: [id number] DOB: [birth year] Sex: female  REASON FOR VISIT:   Follow-up of painful varicose veins of the right lower extremity.  HPI:   Veronica Barker is a pleasant 73 y.o. female who was seen in our office by Dr. Servando Snare on 10/20/2018.  The patient presented with significant varicose veins of the right lower extremity which were painful.  She had not been wearing compression stockings.  She said no prior venous interventions.  On exam she had significantly dilated varicose veins along the lateral aspect of his distal right thigh and proximal right calf.  She had reflux in the right great saphenous vein.  She was put in thigh-high compression stockings with a gradient of 20 to 30 mmHg.  She was encouraged to elevate his leg and take ibuprofen as needed for pain.  She comes in for a 29-month follow-up visit.  Since she was seen last, she continues to have some aching pain and heaviness in the right leg which is aggravated somewhat by standing and sitting and relieved somewhat with elevation.  She has been wearing her thigh-high compression stockings.  She also has significant pain at night which prevents her from sleeping as she likes to sleep on her right side and her varicose veins hurt at night keeping her awake.  She is unaware of any history of DVT or phlebitis.  History reviewed. No pertinent past medical history.  History reviewed. No pertinent family history.  SOCIAL HISTORY: Social History   Tobacco Use  . Smoking status: Former Smoker    Last attempt to quit: 2008    Years since quitting: 12.1  . Smokeless tobacco: Never Used  Substance Use Topics  . Alcohol use: Yes    No Known Allergies  Current Outpatient Medications  Medication Sig Dispense Refill  . aspirin EC 81 MG tablet Take 81 mg by mouth as needed.     . calcium carbonate (CALCIUM 600) 1500 (600 Ca) MG TABS tablet Take by mouth 2 (two) times daily with a meal.    .  simvastatin (ZOCOR) 20 MG tablet      No current facility-administered medications for this visit.     REVIEW OF SYSTEMS:  [X]  denotes positive finding, [ ]  denotes negative finding Cardiac  Comments:  Chest pain or chest pressure:    Shortness of breath upon exertion:    Short of breath when lying flat:    Irregular heart rhythm:        Vascular    Pain in calf, thigh, or hip brought on by ambulation:    Pain in feet at night that wakes you up from your sleep:     Blood clot in your veins:    Leg swelling:         Pulmonary    Oxygen at home:    Productive cough:     Wheezing:         Neurologic    Sudden weakness in arms or legs:     Sudden numbness in arms or legs:     Sudden onset of difficulty speaking or slurred speech:    Temporary loss of vision in one eye:     Problems with dizziness:         Gastrointestinal    Blood in stool:     Vomited blood:         Genitourinary    Burning when urinating:     Blood in urine:  Psychiatric    Major depression:         Hematologic    Bleeding problems:    Problems with blood clotting too easily:        Skin    Rashes or ulcers:        Constitutional    Fever or chills:     PHYSICAL EXAM:   Vitals:   01/18/19 1541  BP: 135/76  Pulse: 70  Resp: 18  SpO2: 96%  Weight: 171 lb (77.6 kg)  Height: 5\' 2"  (1.575 m)    GENERAL: The patient is a well-nourished female, in no acute distress. The vital signs are documented above. CARDIAC: There is a regular rate and rhythm.  VASCULAR: I do not detect carotid bruits. Both feet are warm and well-perfused. VENOUS EXAM: The patient has a large chain of varicose veins which extend across the thigh extending medially to laterally.  She has a large cluster of varicose veins laterally on her distal right thigh and proximal right calf.  These veins are under significant pressure. She also has a significant amount of spider veins in both her thighs and legs  bilaterally. She has some bilateral lower extremity swelling.  Currently she does not have significant hyperpigmentation. I did examine her venous system with the SonoSite myself.  She does not have reflux in the great saphenous vein.  There is reflux at the saphenofemoral junction and then a short segment of an accessory saphenous vein with reflux that feeds the large cluster of varicose veins a stent extending across her thigh.  However that this segment is very short. PULMONARY: There is good air exchange bilaterally without wheezing or rales. MUSCULOSKELETAL: There are no major deformities or cyanosis. NEUROLOGIC: No focal weakness or paresthesias are detected. SKIN: There are no ulcers or rashes noted. PSYCHIATRIC: The patient has a normal affect.  DATA:    VENOUS DUPLEX: I reviewed the venous duplex scan that was done on 10/20/2018.  The patient had no evidence of DVT in the right lower extremity.  There was deep venous reflux involving the common femoral vein.  There was superficial venous reflux involving the saphenofemoral junction and an anterior accessory saphenous vein.  The great saphenous vein in the proximal thigh was dilated to 0.51 cm.    MEDICAL ISSUES:   PAINFUL VARICOSE VEINS RIGHT LOWER EXTREMITY: This patient has significant painful varicose veins of the right thigh and right leg.  She is failed conservative treatment including thigh-high compression stockings, leg elevation, and ibuprofen as needed for pain.  Based on her duplex I do not think she would require laser ablation of the short accessory saphenous vein however I think she would benefit from stab phlebectomies of the these varicosities in her thigh and leg.  She would require greater than 20 stab phlebectomies.   I have discussed with the patient the indications for stab phlebectomy.  I have explained to the patient that that will have small scars from the stab incisions.  I explained that the other risks include leg  swelling, bruising, bleeding, and phlebitis.  All the patient's questions were encouraged and answered and they are agreeable to proceed.  In addition I have encouraged her to continue with her conservative treatment including leg elevation, compression stockings, and exercise.  I also encouraged her to avoid prolonged sitting and standing.  We also discussed the importance of weight management.  We also discussed sclerotherapy which we could consider in the future.  A total of 30  minutes was spent on this visit. 20 minutes was face to face time. More than 50% of the time was spent on counseling and coordinating with the patient.     Deitra Mayo [facility] [phone]

## 2019-02-15 ENCOUNTER — Other Ambulatory Visit: Payer: Self-pay

## 2019-02-15 ENCOUNTER — Encounter: Payer: Self-pay | Admitting: Vascular Surgery

## 2019-02-15 ENCOUNTER — Ambulatory Visit: Payer: PPO | Admitting: Vascular Surgery

## 2019-02-15 VITALS — BP 134/76 | HR 62 | Temp 97.6°F | Resp 18 | Ht 61.0 in | Wt 173.0 lb

## 2019-02-15 DIAGNOSIS — I83811 Varicose veins of right lower extremities with pain: Secondary | ICD-10-CM | POA: Diagnosis not present

## 2019-02-15 HISTORY — PX: OTHER SURGICAL HISTORY: SHX169

## 2019-02-15 NOTE — Progress Notes (Signed)
   Patient name: Veronica Barker MRN: [id number] DOB: May 29, 1946 Sex: female  REASON FOR VISIT:   For stab phlebectomies.  HPI:   Veronica Barker is a pleasant 73 y.o. female who I last saw on 01/18/2019.  She had presented with painful varicose veins of the right lower extremity and had failed conservative treatment.  On exam she had a large chain of varicose veins which extend across the thigh extending medially to laterally.  There was a large cluster of varicose veins lateral on her distal right thigh and proximal right calf.  These veins were under significant pressure.  She also had significant spider veins both thighs and legs bilaterally.  She did not have reflux in the great saphenous vein on the right.  There was some reflux at saphenofemoral junction.  I felt that she was a good candidate for greater than 20 stab phlebectomies.  Current Outpatient Medications  Medication Sig Dispense Refill  . aspirin EC 81 MG tablet Take 81 mg by mouth as needed.     . calcium carbonate (CALCIUM 600) 1500 (600 Ca) MG TABS tablet Take by mouth 2 (two) times daily with a meal.    . simvastatin (ZOCOR) 20 MG tablet      No current facility-administered medications for this visit.     REVIEW OF SYSTEMS:  [X]  denotes positive finding, [ ]  denotes negative finding Vascular    Leg swelling    Cardiac    Chest pain or chest pressure:    Shortness of breath upon exertion:    Short of breath when lying flat:    Irregular heart rhythm:    Constitutional    Fever or chills:     PHYSICAL EXAM:   There were no vitals filed for this visit.  GENERAL: The patient is a well-nourished female, in no acute distress. The vital signs are documented above. CARDIOVASCULAR: There is a regular rate and rhythm.  DATA:   No new data  MEDICAL ISSUES:   STAB PHLEBECTOMIES RIGHT LOWER EXTREMITY: The patient was taken to the exam room and her dilated veins were marked with the patient standing.  The patient was  then placed supine.  The right lower extremity was prepped and draped in usual sterile fashion.  Tumescent anesthesia was administered at all the marked areas.  Next using small stab incisions with an 11 blade approximately 25 stab incisions were made over the all the marked areas the vein was retracted above the skin with a hook and then gently removed with blunt dissection using hemostats.  Pressure was held for hemostasis.  A sterile pressure dressing was applied.  The patient tolerated procedure well.  She will return in 2 to 3 weeks.  Deitra Mayo [facility] [phone]

## 2019-02-15 NOTE — Progress Notes (Signed)
    Stab Phlebectomy Procedure  Veronica Barker DOB:05/18/1946  02/15/2019  Consent signed: Yes  Surgeon:C. Scot Dock, MD  Procedure: stab phlebectomy: right leg  BP 134/76 (BP Location: Left Arm, Patient Position: Sitting, Cuff Size: Large)   Pulse 62   Temp 97.6 F (36.4 C) (Oral)   Resp 18   Ht 5\' 1"  (1.549 m)   Wt 173 lb (78.5 kg)   SpO2 100%   BMI 32.69 kg/m   Start time: 11:00AM   End time: 12:00 PM   Tumescent Anesthesia: 400 cc 0.9% NaCl with 50 cc Lidocaine HCL 1 % and 15 cc 8.4% NaHCO3  Local Anesthesia: 8 cc Lidocaine HCL and NaHCO3 (ratio 2:1)    Stab Phlebectomy: > 20 incisions  Sites: Thigh and Calf  Patient tolerated procedure well: Yes    Description of Procedure:  After marking the course of the secondary varicosities, the patient was placed on the operating table in the supine position, and the right leg was prepped and draped in sterile fashion.    The patient was then put into Trendelenburg position.  Local anesthetic was administered at the previously marked varicosities, and tumescent anesthesia was administered around the vessels.  Greater than 20 stab wounds were made using the tip of an 11 blade. And using the vein hook, the phlebectomies were performed using a hemostat to avulse the varicosities.  Adequate hemostasis was achieved, and steri strips were applied to the stab wound.      ABD pads and thigh high compression stockings were applied as well ace wraps where needed. Blood loss was less than 15 cc.  The patient ambulated out of the operating room having tolerated the procedure well.

## 2019-03-08 ENCOUNTER — Ambulatory Visit: Payer: PPO | Admitting: Vascular Surgery

## 2019-07-27 DIAGNOSIS — Z0000 Encounter for general adult medical examination without abnormal findings: Secondary | ICD-10-CM | POA: Diagnosis not present

## 2019-07-27 DIAGNOSIS — Z1389 Encounter for screening for other disorder: Secondary | ICD-10-CM | POA: Diagnosis not present

## 2019-08-03 DIAGNOSIS — M8580 Other specified disorders of bone density and structure, unspecified site: Secondary | ICD-10-CM | POA: Diagnosis not present

## 2019-08-03 DIAGNOSIS — J309 Allergic rhinitis, unspecified: Secondary | ICD-10-CM | POA: Diagnosis not present

## 2019-08-03 DIAGNOSIS — R509 Fever, unspecified: Secondary | ICD-10-CM | POA: Diagnosis not present

## 2019-08-03 DIAGNOSIS — E782 Mixed hyperlipidemia: Secondary | ICD-10-CM | POA: Diagnosis not present

## 2019-08-06 ENCOUNTER — Other Ambulatory Visit: Payer: Self-pay | Admitting: Internal Medicine

## 2019-08-06 DIAGNOSIS — M8580 Other specified disorders of bone density and structure, unspecified site: Secondary | ICD-10-CM

## 2019-08-08 DIAGNOSIS — E782 Mixed hyperlipidemia: Secondary | ICD-10-CM | POA: Diagnosis not present

## 2019-10-16 ENCOUNTER — Other Ambulatory Visit: Payer: PPO

## 2020-02-03 ENCOUNTER — Ambulatory Visit: Payer: PPO | Attending: Internal Medicine

## 2020-02-03 DIAGNOSIS — Z23 Encounter for immunization: Secondary | ICD-10-CM | POA: Insufficient documentation

## 2020-02-03 NOTE — Progress Notes (Signed)
   Covid-19 Vaccination Clinic  Name:  Veronica Barker    MRN: [id number] DOB: August 08, 1946  02/03/2020  [name] was observed post Covid-19 immunization for 15 minutes without incidence. She was provided with Vaccine Information Sheet and instruction to access the V-Safe system.   [name] was instructed to call 911 with any severe reactions post vaccine: Marland Kitchen Difficulty breathing  . Swelling of your face and throat  . A fast heartbeat  . A bad rash all over your body  . Dizziness and weakness    Immunizations Administered    Name Date Dose VIS Date Route   Pfizer COVID-19 Vaccine 02/03/2020 11:00 AM 0.3 mL 11/23/2019 Intramuscular   Manufacturer: [place]   Lot: [id number]   [place]: [id number]

## 2020-02-26 ENCOUNTER — Ambulatory Visit: Payer: PPO | Attending: Internal Medicine

## 2020-02-26 DIAGNOSIS — Z23 Encounter for immunization: Secondary | ICD-10-CM

## 2020-02-26 NOTE — Progress Notes (Signed)
   Covid-19 Vaccination Clinic  Name:  SHARDEY GALO    MRN: [id number] DOB: 12-Aug-1946  02/26/2020  Ms. Wagenblast was observed post Covid-19 immunization for 15 minutes without incident. She was provided with Vaccine Information Sheet and instruction to access the V-Safe system.   Ms. Howrey was instructed to call 911 with any severe reactions post vaccine: Marland Kitchen Difficulty breathing  . Swelling of face and throat  . A fast heartbeat  . A bad rash all over body  . Dizziness and weakness   Immunizations Administered    Name Date Dose VIS Date Route   Pfizer COVID-19 Vaccine 02/26/2020 10:21 AM 0.3 mL 11/23/2019 Intramuscular   Manufacturer: [place]   Lot: [id number]   [place]: [id number]

## 2020-08-08 DIAGNOSIS — Z0000 Encounter for general adult medical examination without abnormal findings: Secondary | ICD-10-CM | POA: Diagnosis not present

## 2020-08-08 DIAGNOSIS — M8580 Other specified disorders of bone density and structure, unspecified site: Secondary | ICD-10-CM | POA: Diagnosis not present

## 2020-08-08 DIAGNOSIS — E782 Mixed hyperlipidemia: Secondary | ICD-10-CM | POA: Diagnosis not present

## 2020-08-08 DIAGNOSIS — Z1389 Encounter for screening for other disorder: Secondary | ICD-10-CM | POA: Diagnosis not present

## 2020-08-08 DIAGNOSIS — K219 Gastro-esophageal reflux disease without esophagitis: Secondary | ICD-10-CM | POA: Diagnosis not present

## 2020-08-08 DIAGNOSIS — R0789 Other chest pain: Secondary | ICD-10-CM | POA: Diagnosis not present

## 2020-08-08 DIAGNOSIS — Z23 Encounter for immunization: Secondary | ICD-10-CM | POA: Diagnosis not present

## 2020-08-13 ENCOUNTER — Other Ambulatory Visit: Payer: Self-pay | Admitting: Internal Medicine

## 2020-08-13 DIAGNOSIS — Z1231 Encounter for screening mammogram for malignant neoplasm of breast: Secondary | ICD-10-CM

## 2020-08-13 DIAGNOSIS — M8580 Other specified disorders of bone density and structure, unspecified site: Secondary | ICD-10-CM

## 2020-09-02 DIAGNOSIS — K219 Gastro-esophageal reflux disease without esophagitis: Secondary | ICD-10-CM | POA: Diagnosis not present

## 2020-10-14 DIAGNOSIS — L309 Dermatitis, unspecified: Secondary | ICD-10-CM | POA: Diagnosis not present

## 2020-10-14 DIAGNOSIS — L820 Inflamed seborrheic keratosis: Secondary | ICD-10-CM | POA: Diagnosis not present

## 2020-10-14 DIAGNOSIS — L218 Other seborrheic dermatitis: Secondary | ICD-10-CM | POA: Diagnosis not present

## 2020-12-01 ENCOUNTER — Other Ambulatory Visit: Payer: Self-pay

## 2020-12-01 ENCOUNTER — Ambulatory Visit
Admission: RE | Admit: 2020-12-01 | Discharge: 2020-12-01 | Disposition: A | Discharge status: Home / Self Care | Payer: PPO | Source: Ambulatory Visit | Attending: Internal Medicine | Admitting: Internal Medicine

## 2020-12-01 DIAGNOSIS — Z1231 Encounter for screening mammogram for malignant neoplasm of breast: Secondary | ICD-10-CM | POA: Diagnosis not present

## 2020-12-01 DIAGNOSIS — M8589 Other specified disorders of bone density and structure, multiple sites: Secondary | ICD-10-CM | POA: Diagnosis not present

## 2020-12-01 DIAGNOSIS — Z780 Asymptomatic menopausal state: Secondary | ICD-10-CM | POA: Diagnosis not present

## 2020-12-01 DIAGNOSIS — M8580 Other specified disorders of bone density and structure, unspecified site: Secondary | ICD-10-CM

## 2021-01-28 IMAGING — MG DIGITAL SCREENING BILAT W/ TOMO W/ CAD
8 series · 8 of 24 positions shown · non-contrast
Comparison: Previous exam(s).

CLINICAL DATA: Screening.

EXAM:
DIGITAL SCREENING BILATERAL MAMMOGRAM WITH TOMO AND CAD

[R MLO synth-2D]
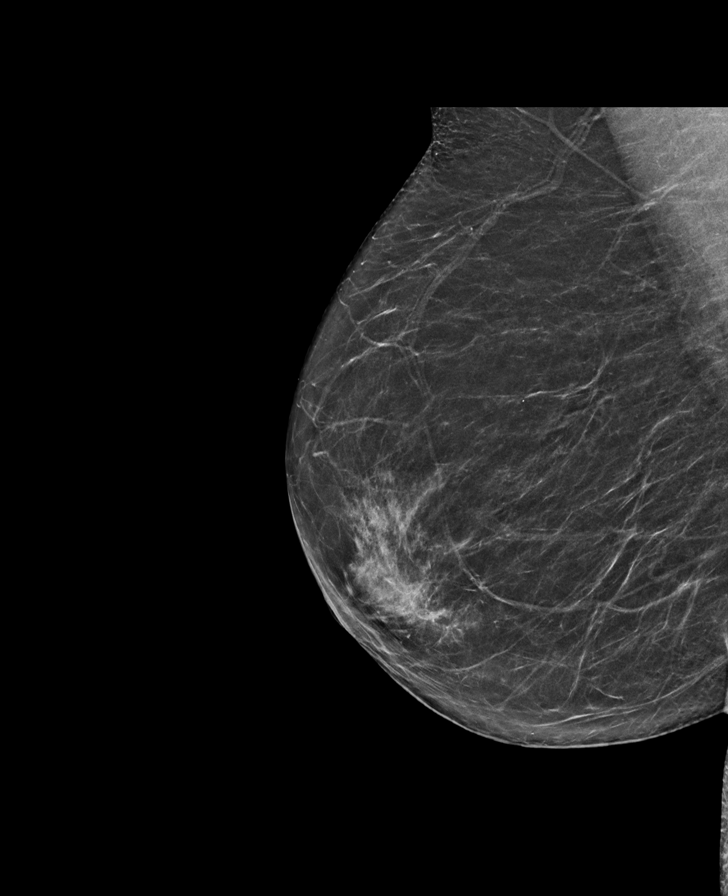

[L CC synth-2D]
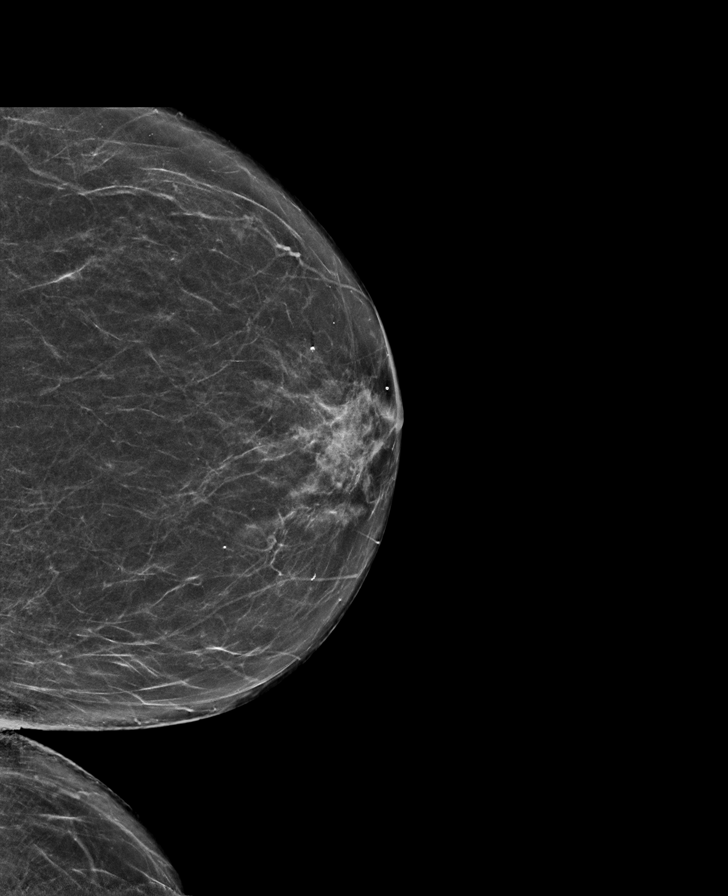

[L MLO synth-2D]
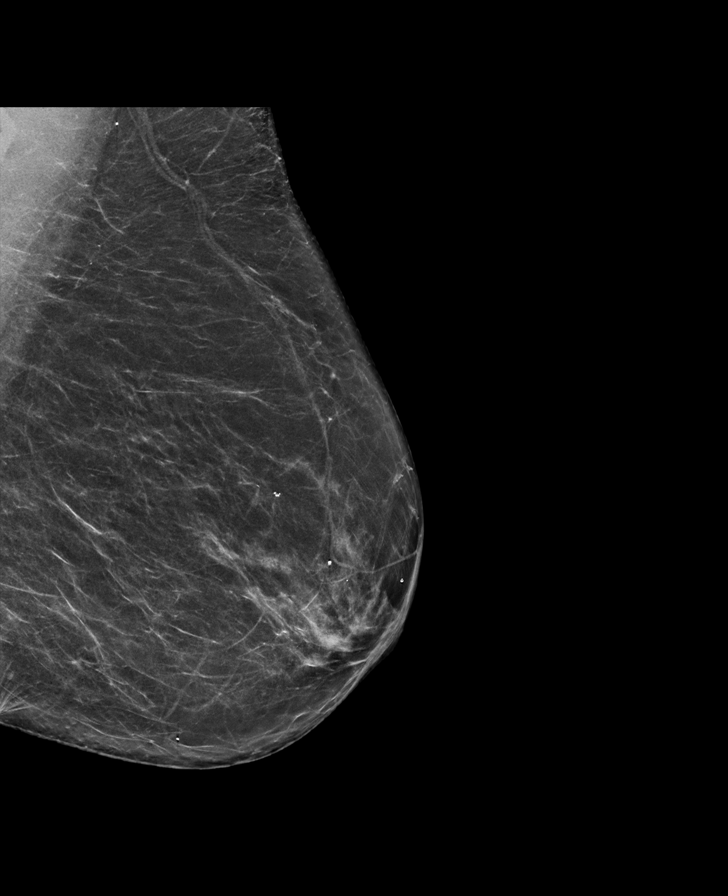

[R CC synth-2D]
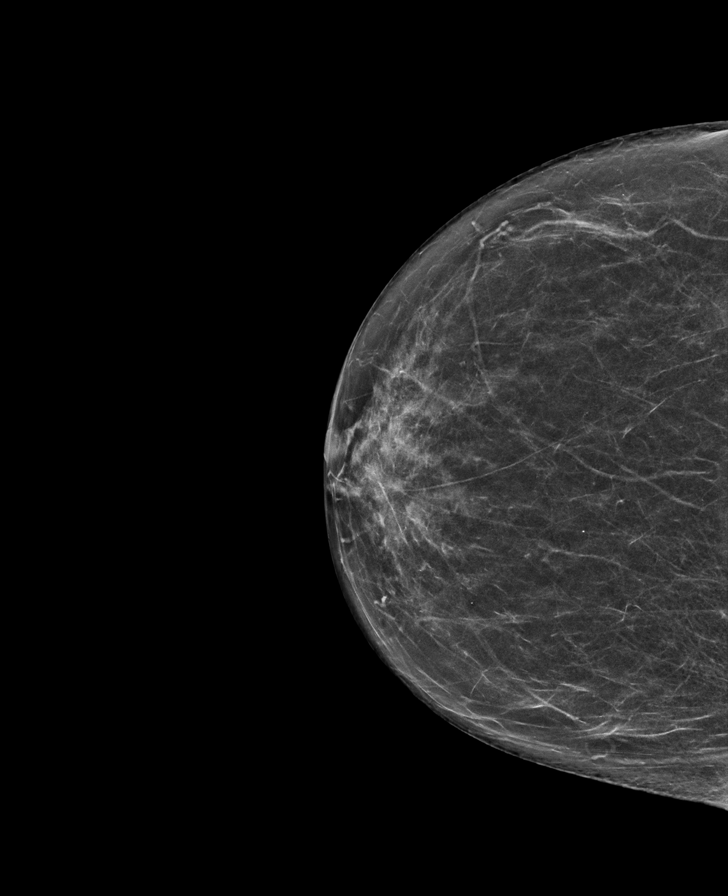

[L CC tomo · tomo slice 31/62.0]
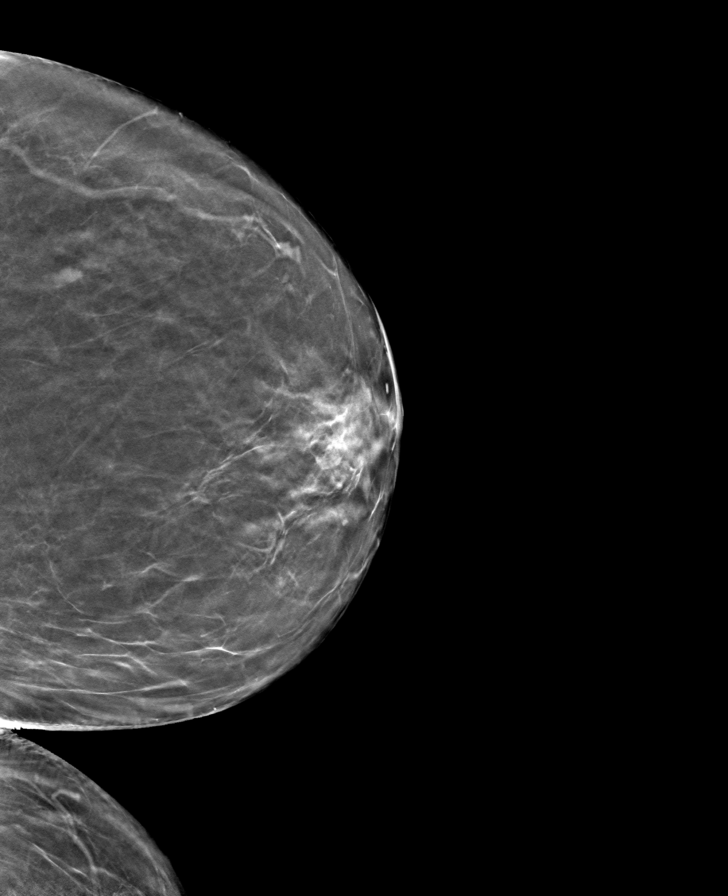

[L MLO tomo · tomo slice 35/70.0]
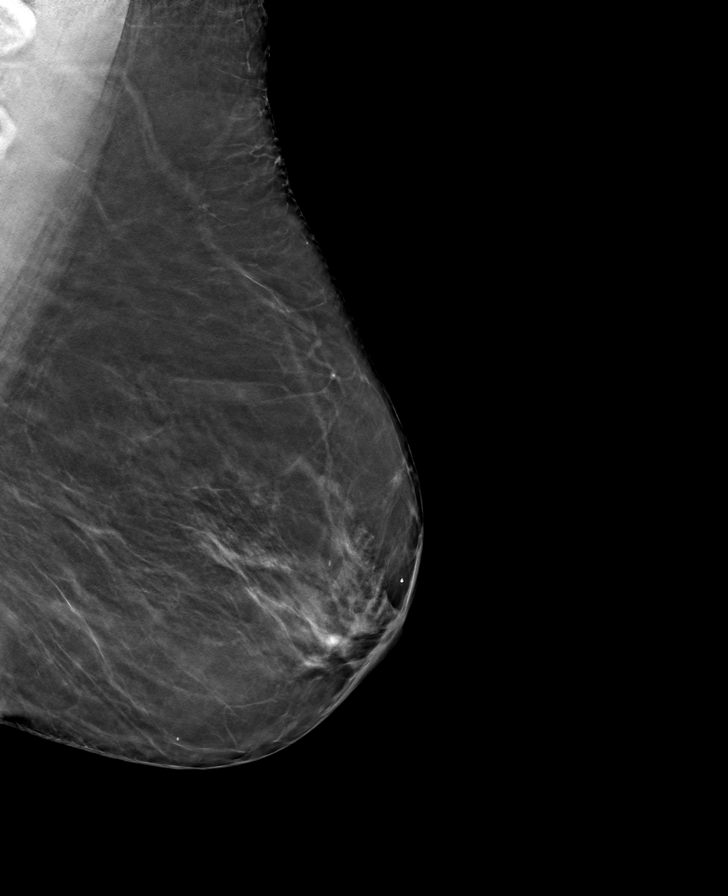

[R CC tomo · tomo slice 31/62.0]
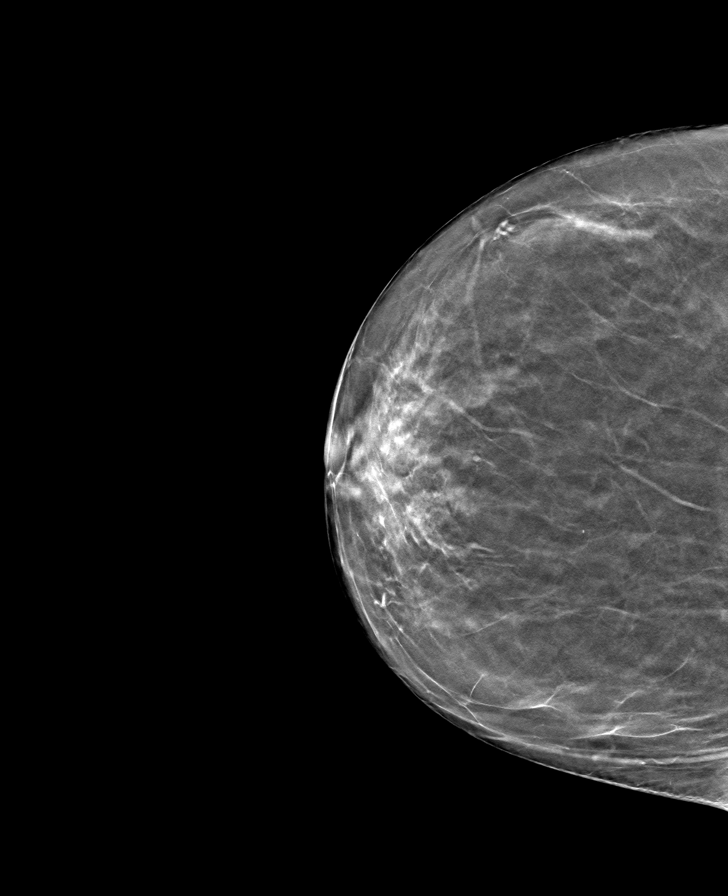

[R MLO tomo · tomo slice 33/66.0]
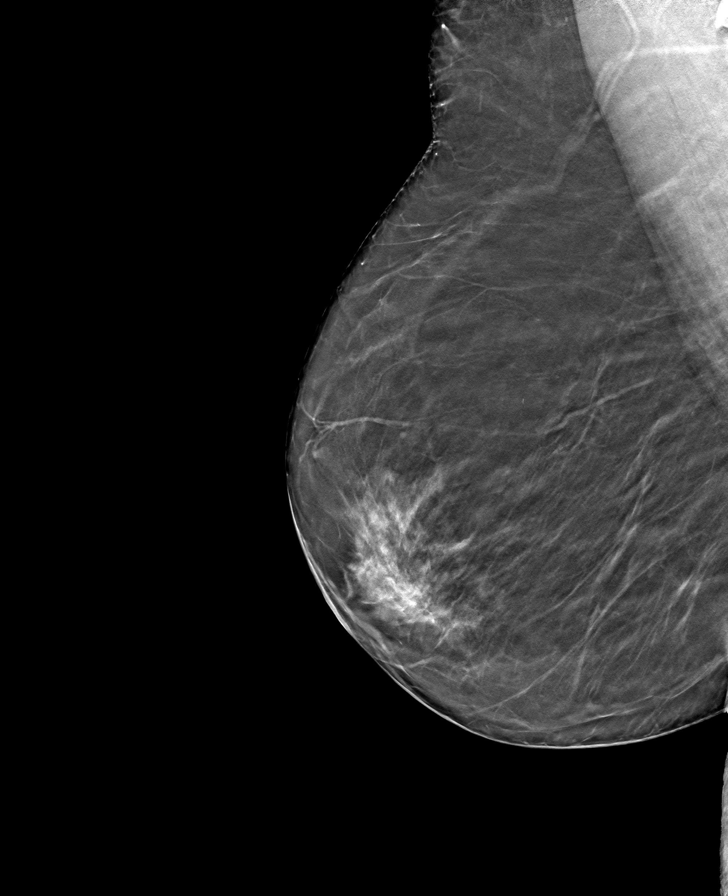

[8 of 24 positions shown; findings below may reference images not displayed]

ACR Breast Density Category b: There are scattered areas of
fibroglandular density.
FINDINGS: There are no findings suspicious for malignancy. Images were
processed with CAD.
IMPRESSION: No mammographic evidence of malignancy. A result letter of this
screening mammogram will be mailed directly to the patient.

RECOMMENDATION:
Screening mammogram in one year. (Code:[id number])

BI-RADS CATEGORY  1: Negative.

## 2021-05-28 ENCOUNTER — Ambulatory Visit (INDEPENDENT_AMBULATORY_CARE_PROVIDER_SITE_OTHER): Payer: PPO

## 2021-05-28 ENCOUNTER — Other Ambulatory Visit: Payer: Self-pay

## 2021-05-28 ENCOUNTER — Ambulatory Visit: Payer: PPO | Admitting: Podiatry

## 2021-05-28 ENCOUNTER — Encounter: Payer: Self-pay | Admitting: Podiatry

## 2021-05-28 DIAGNOSIS — G5791 Unspecified mononeuropathy of right lower limb: Secondary | ICD-10-CM

## 2021-05-28 DIAGNOSIS — J329 Chronic sinusitis, unspecified: Secondary | ICD-10-CM | POA: Insufficient documentation

## 2021-05-28 DIAGNOSIS — K219 Gastro-esophageal reflux disease without esophagitis: Secondary | ICD-10-CM | POA: Insufficient documentation

## 2021-05-28 DIAGNOSIS — M8580 Other specified disorders of bone density and structure, unspecified site: Secondary | ICD-10-CM | POA: Insufficient documentation

## 2021-05-28 DIAGNOSIS — E669 Obesity, unspecified: Secondary | ICD-10-CM | POA: Insufficient documentation

## 2021-05-28 DIAGNOSIS — M778 Other enthesopathies, not elsewhere classified: Secondary | ICD-10-CM

## 2021-05-28 DIAGNOSIS — L509 Urticaria, unspecified: Secondary | ICD-10-CM | POA: Insufficient documentation

## 2021-05-28 DIAGNOSIS — J309 Allergic rhinitis, unspecified: Secondary | ICD-10-CM | POA: Insufficient documentation

## 2021-05-28 DIAGNOSIS — D126 Benign neoplasm of colon, unspecified: Secondary | ICD-10-CM | POA: Insufficient documentation

## 2021-05-28 MED ORDER — TRIAMCINOLONE ACETONIDE 40 MG/ML IJ SUSP
20.0000 mg | Freq: Once | INTRAMUSCULAR | Status: AC
  Administered 2021-05-28: 20 mg

## 2021-05-30 NOTE — Progress Notes (Signed)
She presents today chief complaint of pain to the dorsal aspect of the forefoot and the anterior shin.  She states that is been aching for about 3 weeks now states that the toes are even sensitive at night and she is tried muscle rub.  She says it feels like it did back in 2019.  Objective: Vital signs are stable she is alert oriented x3.  Pulses are palpable.  Neurologic Sirmans intact though she does have pain on palpation of the deep peroneal nerve of that right foot.  Same as before.  Assessment: Deep peroneal nerve neuritis.  Plan: Injected the area today 10 mg Kenalog 5 mg Marcaine to the point of maximal tenderness within the proximal aspect of the first intermetatarsal space.  Follow-up with me as needed we did discuss the possible need for alcohol injections.

## 2021-08-13 DIAGNOSIS — Z1389 Encounter for screening for other disorder: Secondary | ICD-10-CM | POA: Diagnosis not present

## 2021-08-13 DIAGNOSIS — Z0000 Encounter for general adult medical examination without abnormal findings: Secondary | ICD-10-CM | POA: Diagnosis not present

## 2021-08-13 DIAGNOSIS — M8580 Other specified disorders of bone density and structure, unspecified site: Secondary | ICD-10-CM | POA: Diagnosis not present

## 2021-08-13 DIAGNOSIS — M79645 Pain in left finger(s): Secondary | ICD-10-CM | POA: Diagnosis not present

## 2021-08-13 DIAGNOSIS — E782 Mixed hyperlipidemia: Secondary | ICD-10-CM | POA: Diagnosis not present

## 2021-08-13 DIAGNOSIS — M79604 Pain in right leg: Secondary | ICD-10-CM | POA: Diagnosis not present

## 2021-08-13 DIAGNOSIS — D126 Benign neoplasm of colon, unspecified: Secondary | ICD-10-CM | POA: Diagnosis not present

## 2021-09-23 DIAGNOSIS — H18413 Arcus senilis, bilateral: Secondary | ICD-10-CM | POA: Diagnosis not present

## 2021-09-23 DIAGNOSIS — H25013 Cortical age-related cataract, bilateral: Secondary | ICD-10-CM | POA: Diagnosis not present

## 2021-09-23 DIAGNOSIS — H2511 Age-related nuclear cataract, right eye: Secondary | ICD-10-CM | POA: Diagnosis not present

## 2021-09-23 DIAGNOSIS — H35373 Puckering of macula, bilateral: Secondary | ICD-10-CM | POA: Diagnosis not present

## 2021-09-23 DIAGNOSIS — H2513 Age-related nuclear cataract, bilateral: Secondary | ICD-10-CM | POA: Diagnosis not present

## 2021-09-23 DIAGNOSIS — H25043 Posterior subcapsular polar age-related cataract, bilateral: Secondary | ICD-10-CM | POA: Diagnosis not present

## 2021-10-13 DIAGNOSIS — H2511 Age-related nuclear cataract, right eye: Secondary | ICD-10-CM | POA: Diagnosis not present

## 2021-10-13 DIAGNOSIS — H2512 Age-related nuclear cataract, left eye: Secondary | ICD-10-CM | POA: Diagnosis not present

## 2021-10-13 DIAGNOSIS — H25042 Posterior subcapsular polar age-related cataract, left eye: Secondary | ICD-10-CM | POA: Diagnosis not present

## 2021-10-13 DIAGNOSIS — H25012 Cortical age-related cataract, left eye: Secondary | ICD-10-CM | POA: Diagnosis not present

## 2021-10-20 DIAGNOSIS — H2512 Age-related nuclear cataract, left eye: Secondary | ICD-10-CM | POA: Diagnosis not present

## 2021-11-24 DIAGNOSIS — Z9849 Cataract extraction status, unspecified eye: Secondary | ICD-10-CM | POA: Diagnosis not present

## 2021-11-24 DIAGNOSIS — H5212 Myopia, left eye: Secondary | ICD-10-CM | POA: Diagnosis not present

## 2021-11-24 DIAGNOSIS — H52223 Regular astigmatism, bilateral: Secondary | ICD-10-CM | POA: Diagnosis not present

## 2022-02-03 DIAGNOSIS — H5203 Hypermetropia, bilateral: Secondary | ICD-10-CM | POA: Diagnosis not present

## 2022-02-03 DIAGNOSIS — H35373 Puckering of macula, bilateral: Secondary | ICD-10-CM | POA: Diagnosis not present

## 2022-02-03 DIAGNOSIS — H52223 Regular astigmatism, bilateral: Secondary | ICD-10-CM | POA: Diagnosis not present

## 2022-07-09 DIAGNOSIS — L821 Other seborrheic keratosis: Secondary | ICD-10-CM | POA: Diagnosis not present

## 2022-07-09 DIAGNOSIS — L308 Other specified dermatitis: Secondary | ICD-10-CM | POA: Diagnosis not present

## 2022-08-19 DIAGNOSIS — Z0000 Encounter for general adult medical examination without abnormal findings: Secondary | ICD-10-CM | POA: Diagnosis not present

## 2022-08-19 DIAGNOSIS — M8580 Other specified disorders of bone density and structure, unspecified site: Secondary | ICD-10-CM | POA: Diagnosis not present

## 2022-08-19 DIAGNOSIS — Z79899 Other long term (current) drug therapy: Secondary | ICD-10-CM | POA: Diagnosis not present

## 2022-08-19 DIAGNOSIS — E782 Mixed hyperlipidemia: Secondary | ICD-10-CM | POA: Diagnosis not present

## 2022-08-19 DIAGNOSIS — D126 Benign neoplasm of colon, unspecified: Secondary | ICD-10-CM | POA: Diagnosis not present

## 2022-08-19 DIAGNOSIS — E559 Vitamin D deficiency, unspecified: Secondary | ICD-10-CM | POA: Diagnosis not present

## 2022-08-19 DIAGNOSIS — J0100 Acute maxillary sinusitis, unspecified: Secondary | ICD-10-CM | POA: Diagnosis not present

## 2022-08-19 DIAGNOSIS — M79604 Pain in right leg: Secondary | ICD-10-CM | POA: Diagnosis not present

## 2022-09-01 DIAGNOSIS — H26491 Other secondary cataract, right eye: Secondary | ICD-10-CM | POA: Diagnosis not present

## 2022-09-01 DIAGNOSIS — Z961 Presence of intraocular lens: Secondary | ICD-10-CM | POA: Diagnosis not present

## 2022-09-01 DIAGNOSIS — H35373 Puckering of macula, bilateral: Secondary | ICD-10-CM | POA: Diagnosis not present

## 2022-09-01 DIAGNOSIS — H02834 Dermatochalasis of left upper eyelid: Secondary | ICD-10-CM | POA: Diagnosis not present

## 2022-09-01 DIAGNOSIS — H18413 Arcus senilis, bilateral: Secondary | ICD-10-CM | POA: Diagnosis not present

## 2022-09-07 ENCOUNTER — Other Ambulatory Visit: Payer: Self-pay | Admitting: Internal Medicine

## 2022-09-07 DIAGNOSIS — Z1231 Encounter for screening mammogram for malignant neoplasm of breast: Secondary | ICD-10-CM

## 2022-09-07 DIAGNOSIS — M8588 Other specified disorders of bone density and structure, other site: Secondary | ICD-10-CM

## 2022-09-10 DIAGNOSIS — Z9841 Cataract extraction status, right eye: Secondary | ICD-10-CM | POA: Diagnosis not present

## 2022-09-10 DIAGNOSIS — H26492 Other secondary cataract, left eye: Secondary | ICD-10-CM | POA: Diagnosis not present

## 2022-09-10 DIAGNOSIS — Z961 Presence of intraocular lens: Secondary | ICD-10-CM | POA: Diagnosis not present

## 2022-09-10 DIAGNOSIS — H524 Presbyopia: Secondary | ICD-10-CM | POA: Diagnosis not present

## 2022-09-10 DIAGNOSIS — H5203 Hypermetropia, bilateral: Secondary | ICD-10-CM | POA: Diagnosis not present

## 2022-09-10 DIAGNOSIS — H52223 Regular astigmatism, bilateral: Secondary | ICD-10-CM | POA: Diagnosis not present

## 2022-09-27 ENCOUNTER — Other Ambulatory Visit: Payer: Self-pay | Admitting: Internal Medicine

## 2022-09-27 DIAGNOSIS — Z1231 Encounter for screening mammogram for malignant neoplasm of breast: Secondary | ICD-10-CM

## 2022-10-04 DIAGNOSIS — H26492 Other secondary cataract, left eye: Secondary | ICD-10-CM | POA: Diagnosis not present

## 2022-10-07 ENCOUNTER — Other Ambulatory Visit: Payer: Self-pay | Admitting: Internal Medicine

## 2022-10-07 DIAGNOSIS — M8588 Other specified disorders of bone density and structure, other site: Secondary | ICD-10-CM

## 2022-10-11 DIAGNOSIS — Z961 Presence of intraocular lens: Secondary | ICD-10-CM | POA: Diagnosis not present

## 2022-10-11 DIAGNOSIS — H5203 Hypermetropia, bilateral: Secondary | ICD-10-CM | POA: Diagnosis not present

## 2022-10-11 DIAGNOSIS — H524 Presbyopia: Secondary | ICD-10-CM | POA: Diagnosis not present

## 2022-10-11 DIAGNOSIS — H52223 Regular astigmatism, bilateral: Secondary | ICD-10-CM | POA: Diagnosis not present

## 2022-10-11 DIAGNOSIS — Z9842 Cataract extraction status, left eye: Secondary | ICD-10-CM | POA: Diagnosis not present

## 2022-10-11 DIAGNOSIS — Z9841 Cataract extraction status, right eye: Secondary | ICD-10-CM | POA: Diagnosis not present

## 2022-10-19 DIAGNOSIS — E782 Mixed hyperlipidemia: Secondary | ICD-10-CM | POA: Diagnosis not present

## 2022-11-15 ENCOUNTER — Ambulatory Visit
Admission: RE | Admit: 2022-11-15 | Discharge: 2022-11-15 | Disposition: A | Discharge status: Home / Self Care | Payer: PPO | Source: Ambulatory Visit | Attending: Internal Medicine | Admitting: Internal Medicine

## 2022-11-15 DIAGNOSIS — Z1231 Encounter for screening mammogram for malignant neoplasm of breast: Secondary | ICD-10-CM

## 2022-11-24 DIAGNOSIS — H00024 Hordeolum internum left upper eyelid: Secondary | ICD-10-CM | POA: Diagnosis not present

## 2022-12-02 DIAGNOSIS — L723 Sebaceous cyst: Secondary | ICD-10-CM | POA: Diagnosis not present

## 2022-12-02 DIAGNOSIS — H00014 Hordeolum externum left upper eyelid: Secondary | ICD-10-CM | POA: Diagnosis not present

## 2024-07-18 DIAGNOSIS — R0981 Nasal congestion: Secondary | ICD-10-CM | POA: Diagnosis not present

## 2024-08-27 DIAGNOSIS — E559 Vitamin D deficiency, unspecified: Secondary | ICD-10-CM | POA: Diagnosis not present

## 2024-08-27 DIAGNOSIS — E66811 Obesity, class 1: Secondary | ICD-10-CM | POA: Diagnosis not present

## 2024-08-27 DIAGNOSIS — M8580 Other specified disorders of bone density and structure, unspecified site: Secondary | ICD-10-CM | POA: Diagnosis not present

## 2024-08-27 DIAGNOSIS — D126 Benign neoplasm of colon, unspecified: Secondary | ICD-10-CM | POA: Diagnosis not present

## 2024-08-27 DIAGNOSIS — S6990XA Unspecified injury of unspecified wrist, hand and finger(s), initial encounter: Secondary | ICD-10-CM | POA: Diagnosis not present

## 2024-08-27 DIAGNOSIS — E782 Mixed hyperlipidemia: Secondary | ICD-10-CM | POA: Diagnosis not present

## 2024-08-27 DIAGNOSIS — Z1331 Encounter for screening for depression: Secondary | ICD-10-CM | POA: Diagnosis not present

## 2024-08-27 DIAGNOSIS — Z23 Encounter for immunization: Secondary | ICD-10-CM | POA: Diagnosis not present

## 2024-08-27 DIAGNOSIS — Z0000 Encounter for general adult medical examination without abnormal findings: Secondary | ICD-10-CM | POA: Diagnosis not present

## 2024-08-30 ENCOUNTER — Other Ambulatory Visit (HOSPITAL_BASED_OUTPATIENT_CLINIC_OR_DEPARTMENT_OTHER): Payer: Self-pay | Admitting: Internal Medicine

## 2024-08-30 DIAGNOSIS — M8580 Other specified disorders of bone density and structure, unspecified site: Secondary | ICD-10-CM

## 2024-09-14 ENCOUNTER — Other Ambulatory Visit: Payer: Self-pay | Admitting: Internal Medicine

## 2024-09-14 DIAGNOSIS — Z1231 Encounter for screening mammogram for malignant neoplasm of breast: Secondary | ICD-10-CM

## 2024-10-04 ENCOUNTER — Ambulatory Visit
Admission: RE | Admit: 2024-10-04 | Discharge: 2024-10-04 | Disposition: A | Discharge status: Home / Self Care | Source: Ambulatory Visit | Attending: Internal Medicine | Admitting: Internal Medicine

## 2024-10-04 DIAGNOSIS — Z1231 Encounter for screening mammogram for malignant neoplasm of breast: Secondary | ICD-10-CM | POA: Diagnosis not present
